# Patient Record
Sex: Male | Born: 1975
Health system: Southern US, Community
[De-identification: ages and names within clinical notes are randomized; demographics above are authoritative.]

## PROBLEM LIST (undated history)

## (undated) DIAGNOSIS — I10 Essential (primary) hypertension: Secondary | ICD-10-CM

## (undated) DIAGNOSIS — N289 Disorder of kidney and ureter, unspecified: Secondary | ICD-10-CM

## (undated) DIAGNOSIS — E119 Type 2 diabetes mellitus without complications: Secondary | ICD-10-CM

---

## 2005-06-02 ENCOUNTER — Emergency Department: Payer: Self-pay | Admitting: Emergency Medicine

## 2012-04-08 ENCOUNTER — Emergency Department (HOSPITAL_COMMUNITY)
Admission: EM | Admit: 2012-04-08 | Discharge: 2012-04-08 | Disposition: A | Discharge status: Home / Self Care | Payer: Self-pay | Attending: Emergency Medicine | Admitting: Emergency Medicine

## 2012-04-08 ENCOUNTER — Encounter (HOSPITAL_COMMUNITY): Payer: Self-pay | Admitting: *Deleted

## 2012-04-08 DIAGNOSIS — L723 Sebaceous cyst: Secondary | ICD-10-CM

## 2012-04-08 DIAGNOSIS — L0211 Cutaneous abscess of neck: Secondary | ICD-10-CM | POA: Insufficient documentation

## 2012-04-08 DIAGNOSIS — F17200 Nicotine dependence, unspecified, uncomplicated: Secondary | ICD-10-CM | POA: Insufficient documentation

## 2012-04-08 MED ORDER — LIDOCAINE-EPINEPHRINE (PF) 1 %-1:200000 IJ SOLN
INTRAMUSCULAR | Status: AC
  Filled 2012-04-08: qty 10

## 2012-04-08 MED ORDER — OXYCODONE-ACETAMINOPHEN 5-325 MG PO TABS
2.0000 | ORAL_TABLET | Freq: Once | ORAL | Status: AC
  Administered 2012-04-08: 2 via ORAL
  Filled 2012-04-08: qty 2

## 2012-04-08 MED ORDER — SULFAMETHOXAZOLE-TRIMETHOPRIM 800-160 MG PO TABS: 1.0000 | ORAL_TABLET | Freq: Two times a day (BID) | ORAL | Status: AC

## 2012-04-08 NOTE — ED Notes (Addendum)
Had swollen area to rt side of neck  For 1 year, Recently increased in size and painful.  Dizzy at times

## 2012-04-08 NOTE — ED Provider Notes (Signed)
History  This chart was scribed for Joya Gaskins, MD by Gerlean Ren. This patient was seen in room [facility] and the patient's care was started at 1:32PM.   CSN: [id number]  Arrival date & time 04/08/12  1210   First MD Initiated Contact with Patient 04/08/12 1332      Chief Complaint  Patient presents with  . Abscess     Patient is a 36 y.o. male presenting with abscess. The history is provided by the patient. No language interpreter was used.  Abscess  This is a recurrent problem. The current episode started less than one week ago. The onset was gradual. The problem occurs continuously. The problem has been gradually worsening. The abscess is present on the neck. The problem is mild. The abscess is characterized by redness. The abscess first occurred at home. Pertinent negatives include no fever and no vomiting. There were no sick contacts. He has received no recent medical care.     Stephen Davenport is a 36 y.o. male who presents to the Emergency Department complaining of gradually-worsening, constant pain associated with an abscess on right lateral neck.  Pt reports that the abscess has been present for about one year but was smaller until 3 days ago, when it began enlarged and red. Pt denies taking any OTC medications to improve pain. He denies drainage from the area. Pt denies fever, HA, visual disturbance, SOB, nausea, emesis, diarrhea and rash as associated symptoms. He does not have a h/o chronic medical conditions. He is a current, everyday smoker and reports alcohol use.   PMH - none  History reviewed. No pertinent past surgical history.  History reviewed. No pertinent family history.  History  Substance Use Topics  . Smoking status: Current Everyday Smoker  . Smokeless tobacco: Not on file  . Alcohol Use: Yes      Review of Systems  Constitutional: Negative for fever and chills.  Gastrointestinal: Negative for nausea and vomiting.  Skin:       abscess    Neurological: Negative for syncope and headaches.    Allergies  Bee venom and Shellfish allergy  Home Medications  No current outpatient prescriptions on file.  BP 128/76  Pulse 68  Temp 98.7 F (37.1 C) (Oral)  Resp 20  Ht 6' (1.829 m)  Wt 260 lb (117.935 kg)  BMI 35.26 kg/m2  SpO2 98%  Physical Exam  Nursing note and vitals reviewed.  CONSTITUTIONAL: Well developed/well nourished HEAD AND FACE: Normocephalic/atraumatic EYES: EOMI/PERRL ENMT: Mucous membranes moist NECK: supple no meningeal signs, abscess on right lateral neck with erythema and fluctuance.  It is clearly delineated from region of carotid.  No thrill noted CV: S1/S2 noted, no murmurs/rubs/gallops noted LUNGS: Lungs are clear to auscultation bilaterally, no apparent distress ABDOMEN: soft, nontender, no rebound or guarding NEURO: Pt is awake/alert, moves all extremitiesx4.  Face is symmetric EXTREMITIES: pulses normal, full ROM SKIN: warm, color normal PSYCH: no abnormalities of mood noted  ED Course  Procedures   INCISION AND DRAINAGE Performed by: Joya Gaskins Consent: Verbal consent obtained. Risks and benefits: risks, benefits and alternatives were discussed Type: abscess  Body area: right neck  Anesthesia: local infiltration  Local anesthetic: lidocaine 1% with epinephrine  Anesthetic total: 3 ml  Complexity: complex Blunt dissection to break up loculations  Drainage: purulent  Drainage amount: moderate  Packing material: 1/4 in iodoform gauze  Patient tolerance: Patient tolerated the procedure well with no immediate complications. clearly superiicial in nature, it was lateral  to carotid.  No active bleeding.  This was likely sebaceous cyst with abscess formation, advised surgical followup    DIAGNOSTIC STUDIES: Oxygen Saturation is 98% on room air, normal by my interpretation.    COORDINATION OF CARE: 1:58PM- Discussed pain medication and I&D of abscess with pt and pt  agreed.     MDM  Nursing notes including past medical history and social history reviewed and considered in documentation   I personally performed the services described in this documentation, which was scribed in my presence. The recorded information has been reviewed and considered.          Joya Gaskins, MD 04/08/12 [id number]

## 2017-03-01 ENCOUNTER — Emergency Department (HOSPITAL_COMMUNITY)
Admission: EM | Admit: 2017-03-01 | Discharge: 2017-03-01 | Disposition: A | Discharge status: Home / Self Care | Payer: Self-pay | Attending: Emergency Medicine | Admitting: Emergency Medicine

## 2017-03-01 ENCOUNTER — Encounter (HOSPITAL_COMMUNITY): Payer: Self-pay | Admitting: Emergency Medicine

## 2017-03-01 ENCOUNTER — Emergency Department (HOSPITAL_COMMUNITY): Payer: Self-pay

## 2017-03-01 DIAGNOSIS — Z87891 Personal history of nicotine dependence: Secondary | ICD-10-CM | POA: Insufficient documentation

## 2017-03-01 DIAGNOSIS — R0602 Shortness of breath: Secondary | ICD-10-CM | POA: Insufficient documentation

## 2017-03-01 DIAGNOSIS — R42 Dizziness and giddiness: Secondary | ICD-10-CM | POA: Insufficient documentation

## 2017-03-01 DIAGNOSIS — Z791 Long term (current) use of non-steroidal anti-inflammatories (NSAID): Secondary | ICD-10-CM | POA: Insufficient documentation

## 2017-03-01 DIAGNOSIS — R079 Chest pain, unspecified: Secondary | ICD-10-CM | POA: Insufficient documentation

## 2017-03-01 LAB — BASIC METABOLIC PANEL WITH GFR
Anion gap: 10 (ref 5–15)
BUN: 12 mg/dL (ref 6–20)
CO2: 23 mmol/L (ref 22–32)
Calcium: 9.3 mg/dL (ref 8.9–10.3)
Chloride: 105 mmol/L (ref 101–111)
Creatinine, Ser: 1.06 mg/dL (ref 0.61–1.24)
GFR calc Af Amer: >60 mL/min
GFR calc non Af Amer: >60 mL/min
Glucose, Bld: 115 mg/dL — ABNORMAL HIGH (ref 65–99)
Potassium: 3.5 mmol/L (ref 3.5–5.1)
Sodium: 138 mmol/L (ref 135–145)

## 2017-03-01 LAB — CBC WITH DIFFERENTIAL/PLATELET
BASOS ABS: 0 10*3/uL (ref 0.0–0.1)
BASOS PCT: 0 %
EOS ABS: 0.2 10*3/uL (ref 0.0–0.7)
Eosinophils Relative: 2 %
HCT: 39.3 % (ref 39.0–52.0)
Hemoglobin: 13.6 g/dL (ref 13.0–17.0)
Lymphocytes Relative: 21 %
Lymphs Abs: 1.6 10*3/uL (ref 0.7–4.0)
MCH: 30.8 pg (ref 26.0–34.0)
MCHC: 34.6 g/dL (ref 30.0–36.0)
MCV: 88.9 fL (ref 78.0–100.0)
MONO ABS: 0.5 10*3/uL (ref 0.1–1.0)
Monocytes Relative: 7 %
Neutro Abs: 5.2 10*3/uL (ref 1.7–7.7)
Neutrophils Relative %: 70 %
PLATELETS: 280 10*3/uL (ref 150–400)
RBC: 4.42 MIL/uL (ref 4.22–5.81)
RDW: 13.2 % (ref 11.5–15.5)
WBC: 7.5 10*3/uL (ref 4.0–10.5)

## 2017-03-01 LAB — TROPONIN I: Troponin I: <0.03 ng/mL

## 2017-03-01 NOTE — ED Notes (Signed)
Patient transported to X-ray 

## 2017-03-01 NOTE — ED Triage Notes (Signed)
Patient c/o left side chest pain that radiates into left arm and occasionally to right side x4 days. Per patient shortness of breath, nausea, and dizziness. Denies any cardiac hx. Patient reports taking advil earlier today with no relief.

## 2017-03-01 NOTE — ED Provider Notes (Signed)
[facility]-EMERGENCY DEPT Provider Note   CSN: [id number] Arrival date & time: 03/01/17  1149     History   Chief Complaint Chief Complaint  Patient presents with  . Chest Pain    HPI Stephen Davenport is a 41 y.o. male.  HPI Patient presents with chest pain. It is dull in his anterior chest. Occasionally goes to right side and has occasional heaviness on his left tricep. Former pack-a-day smoker but quit a year ago. Occasional slight shortness of breath. Has had nausea and occasional dizziness. No known cardiac history but has not been worked up. No known family history: See is adopted. States he's had it constantly for the last 4 days. He does occasionally get a break but his been mostly constant. Not worsened with exertion. No relief with rest. No diaphoresis. Not worsened with movement. Eating does not change the pain.  History reviewed. No pertinent past medical history.  There are no active problems to display for this patient.   History reviewed. No pertinent surgical history.     Home Medications    Prior to Admission medications   Medication Sig Start Date End Date Taking? Authorizing Provider  ibuprofen (ADVIL,MOTRIN) 200 MG tablet Take 400 mg by mouth every 6 (six) hours as needed.   Yes [provider]    Family History Family History  Problem Relation Age of Onset  . Adopted: Yes    Social History Social History  Substance Use Topics  . Smoking status: Former Smoker    Years: 20.00    Types: Cigarettes    Quit date: 12/16/2014  . Smokeless tobacco: Never Used  . Alcohol use No     Allergies   Bee venom and Shellfish allergy   Review of Systems Review of Systems  Constitutional: Negative for appetite change.  HENT: Negative for congestion.   Respiratory: Positive for shortness of breath.   Cardiovascular: Positive for chest pain.  Gastrointestinal: Negative for abdominal distention.  Genitourinary: Negative for dysuria.  Musculoskeletal:  Negative for back pain.  Neurological: Positive for dizziness.  Psychiatric/Behavioral: Negative for confusion.     Physical Exam Updated Vital Signs BP (!) 106/59   Pulse 60   Temp 98.1 F (36.7 C) (Oral)   Resp (!) 9   Ht 6' (1.829 m)   Wt 125.2 kg (276 lb)   SpO2 95%   BMI 37.43 kg/m   Physical Exam  Constitutional: He appears well-developed.  HENT:  Head: Atraumatic.  Eyes: EOM are normal.  Neck: Neck supple.  Pulmonary/Chest: Effort normal.  Abdominal: Soft. There is no tenderness.  Musculoskeletal: Normal range of motion. He exhibits no edema.  Neurological: He is alert.  Skin: Skin is warm. Capillary refill takes less than 2 seconds.  Psychiatric: He has a normal mood and affect.     ED Treatments / Results  Labs (all labs ordered are listed, but only abnormal results are displayed) Labs Reviewed  BASIC METABOLIC PANEL - Abnormal; Notable for the following:       Result Value   Glucose, Bld 115 (*)    All other components within normal limits  CBC WITH DIFFERENTIAL/PLATELET  TROPONIN I    EKG  EKG Interpretation  Date/Time:  Saturday March 01 2017 11:56:08 EDT Ventricular Rate:  73 PR Interval:    QRS Duration: 108 QT Interval:  413 QTC Calculation: 456 R Axis:   56 Text Interpretation:  Sinus rhythm Confirmed by Rubin Payor  MD, Tequlia Gonsalves [phone]) on 03/01/2017 12:19:16 PM  Radiology Dg Chest 2 View  Result Date: 03/01/2017 CLINICAL DATA:  Chest pain.  Shortness of breath. EXAM: CHEST  2 VIEW COMPARISON:  None. FINDINGS: Interstitial prominence, possibly bronchitic. No edema, effusion, or pneumothorax. Normal heart size mediastinal contours. IMPRESSION: Possible bronchitic airway thickening.  Otherwise negative. Electronically Signed   By: Marnee SpringJonathon  Watts M.D.   On: 03/01/2017 13:08    Procedures Procedures (including critical care time)  Medications Ordered in ED Medications - No data to display   Initial Impression / Assessment and Plan  / ED Course  I have reviewed the triage vital signs and the nursing notes.  Pertinent labs & imaging results that were available during my care of the patient were reviewed by me and considered in my medical decision making (see chart for details).     Patient with chest pain. EKG reassuring. Labs reassuring. X-ray shows possible bronchitis. No focal pneumonia. Will discharge home. Discussed with patient and he'll follow-up with her primary care provider. Doubt cardiac cause. Troponin negative.  Final Clinical Impressions(s) / ED Diagnoses   Final diagnoses:  Nonspecific chest pain    New Prescriptions Discharge Medication List as of 03/01/2017  1:50 PM       Benjiman CorePickering, Shaneta Cervenka, MD 03/01/17 2032

## 2017-12-31 ENCOUNTER — Other Ambulatory Visit (HOSPITAL_COMMUNITY): Payer: Self-pay | Admitting: Family Medicine

## 2017-12-31 ENCOUNTER — Ambulatory Visit (HOSPITAL_COMMUNITY)
Admission: RE | Admit: 2017-12-31 | Discharge: 2017-12-31 | Disposition: A | Discharge status: Home / Self Care | Payer: BLUE CROSS/BLUE SHIELD | Source: Ambulatory Visit | Attending: Family Medicine | Admitting: Family Medicine

## 2017-12-31 DIAGNOSIS — M25551 Pain in right hip: Secondary | ICD-10-CM | POA: Insufficient documentation

## 2017-12-31 DIAGNOSIS — R52 Pain, unspecified: Secondary | ICD-10-CM

## 2017-12-31 DIAGNOSIS — Z0001 Encounter for general adult medical examination with abnormal findings: Secondary | ICD-10-CM | POA: Diagnosis not present

## 2017-12-31 DIAGNOSIS — M545 Low back pain: Secondary | ICD-10-CM | POA: Diagnosis not present

## 2017-12-31 DIAGNOSIS — M1611 Unilateral primary osteoarthritis, right hip: Secondary | ICD-10-CM | POA: Diagnosis not present

## 2017-12-31 DIAGNOSIS — M47816 Spondylosis without myelopathy or radiculopathy, lumbar region: Secondary | ICD-10-CM | POA: Insufficient documentation

## 2017-12-31 DIAGNOSIS — R739 Hyperglycemia, unspecified: Secondary | ICD-10-CM | POA: Diagnosis not present

## 2017-12-31 DIAGNOSIS — Z0000 Encounter for general adult medical examination without abnormal findings: Secondary | ICD-10-CM | POA: Diagnosis not present

## 2017-12-31 DIAGNOSIS — K219 Gastro-esophageal reflux disease without esophagitis: Secondary | ICD-10-CM | POA: Diagnosis not present

## 2017-12-31 DIAGNOSIS — Z6839 Body mass index (BMI) 39.0-39.9, adult: Secondary | ICD-10-CM | POA: Diagnosis not present

## 2017-12-31 DIAGNOSIS — Z1389 Encounter for screening for other disorder: Secondary | ICD-10-CM | POA: Diagnosis not present

## 2017-12-31 DIAGNOSIS — Z23 Encounter for immunization: Secondary | ICD-10-CM | POA: Diagnosis not present

## 2017-12-31 DIAGNOSIS — R079 Chest pain, unspecified: Secondary | ICD-10-CM | POA: Diagnosis not present

## 2018-01-01 DIAGNOSIS — R748 Abnormal levels of other serum enzymes: Secondary | ICD-10-CM | POA: Diagnosis not present

## 2018-01-14 DIAGNOSIS — M545 Low back pain: Secondary | ICD-10-CM | POA: Diagnosis not present

## 2018-01-14 DIAGNOSIS — E119 Type 2 diabetes mellitus without complications: Secondary | ICD-10-CM | POA: Diagnosis not present

## 2018-01-14 DIAGNOSIS — M25559 Pain in unspecified hip: Secondary | ICD-10-CM | POA: Diagnosis not present

## 2018-01-28 DIAGNOSIS — M7061 Trochanteric bursitis, right hip: Secondary | ICD-10-CM | POA: Diagnosis not present

## 2018-01-28 DIAGNOSIS — M25551 Pain in right hip: Secondary | ICD-10-CM | POA: Diagnosis not present

## 2018-01-28 DIAGNOSIS — M545 Low back pain: Secondary | ICD-10-CM | POA: Diagnosis not present

## 2018-02-06 DIAGNOSIS — N200 Calculus of kidney: Secondary | ICD-10-CM | POA: Diagnosis not present

## 2018-02-06 DIAGNOSIS — R945 Abnormal results of liver function studies: Secondary | ICD-10-CM | POA: Diagnosis not present

## 2018-02-06 DIAGNOSIS — R932 Abnormal findings on diagnostic imaging of liver and biliary tract: Secondary | ICD-10-CM | POA: Diagnosis not present

## 2018-02-06 DIAGNOSIS — Z6838 Body mass index (BMI) 38.0-38.9, adult: Secondary | ICD-10-CM | POA: Diagnosis not present

## 2018-11-13 DIAGNOSIS — E119 Type 2 diabetes mellitus without complications: Secondary | ICD-10-CM | POA: Diagnosis not present

## 2018-11-13 DIAGNOSIS — Z6836 Body mass index (BMI) 36.0-36.9, adult: Secondary | ICD-10-CM | POA: Diagnosis not present

## 2018-11-13 DIAGNOSIS — E6609 Other obesity due to excess calories: Secondary | ICD-10-CM | POA: Diagnosis not present

## 2018-11-13 DIAGNOSIS — Z1389 Encounter for screening for other disorder: Secondary | ICD-10-CM | POA: Diagnosis not present

## 2018-11-13 DIAGNOSIS — E781 Pure hyperglyceridemia: Secondary | ICD-10-CM | POA: Diagnosis not present

## 2019-06-30 IMAGING — DX DG HIP (WITH OR WITHOUT PELVIS) 4+V*R*
3 series · 3 of 3 positions shown · non-contrast
Comparison: None.

CLINICAL DATA: Right-sided hip pain for several years, no known
injury, initial encounter

EXAM:
DG HIP (WITH OR WITHOUT PELVIS) 3V RIGHT

[pelvis ap]
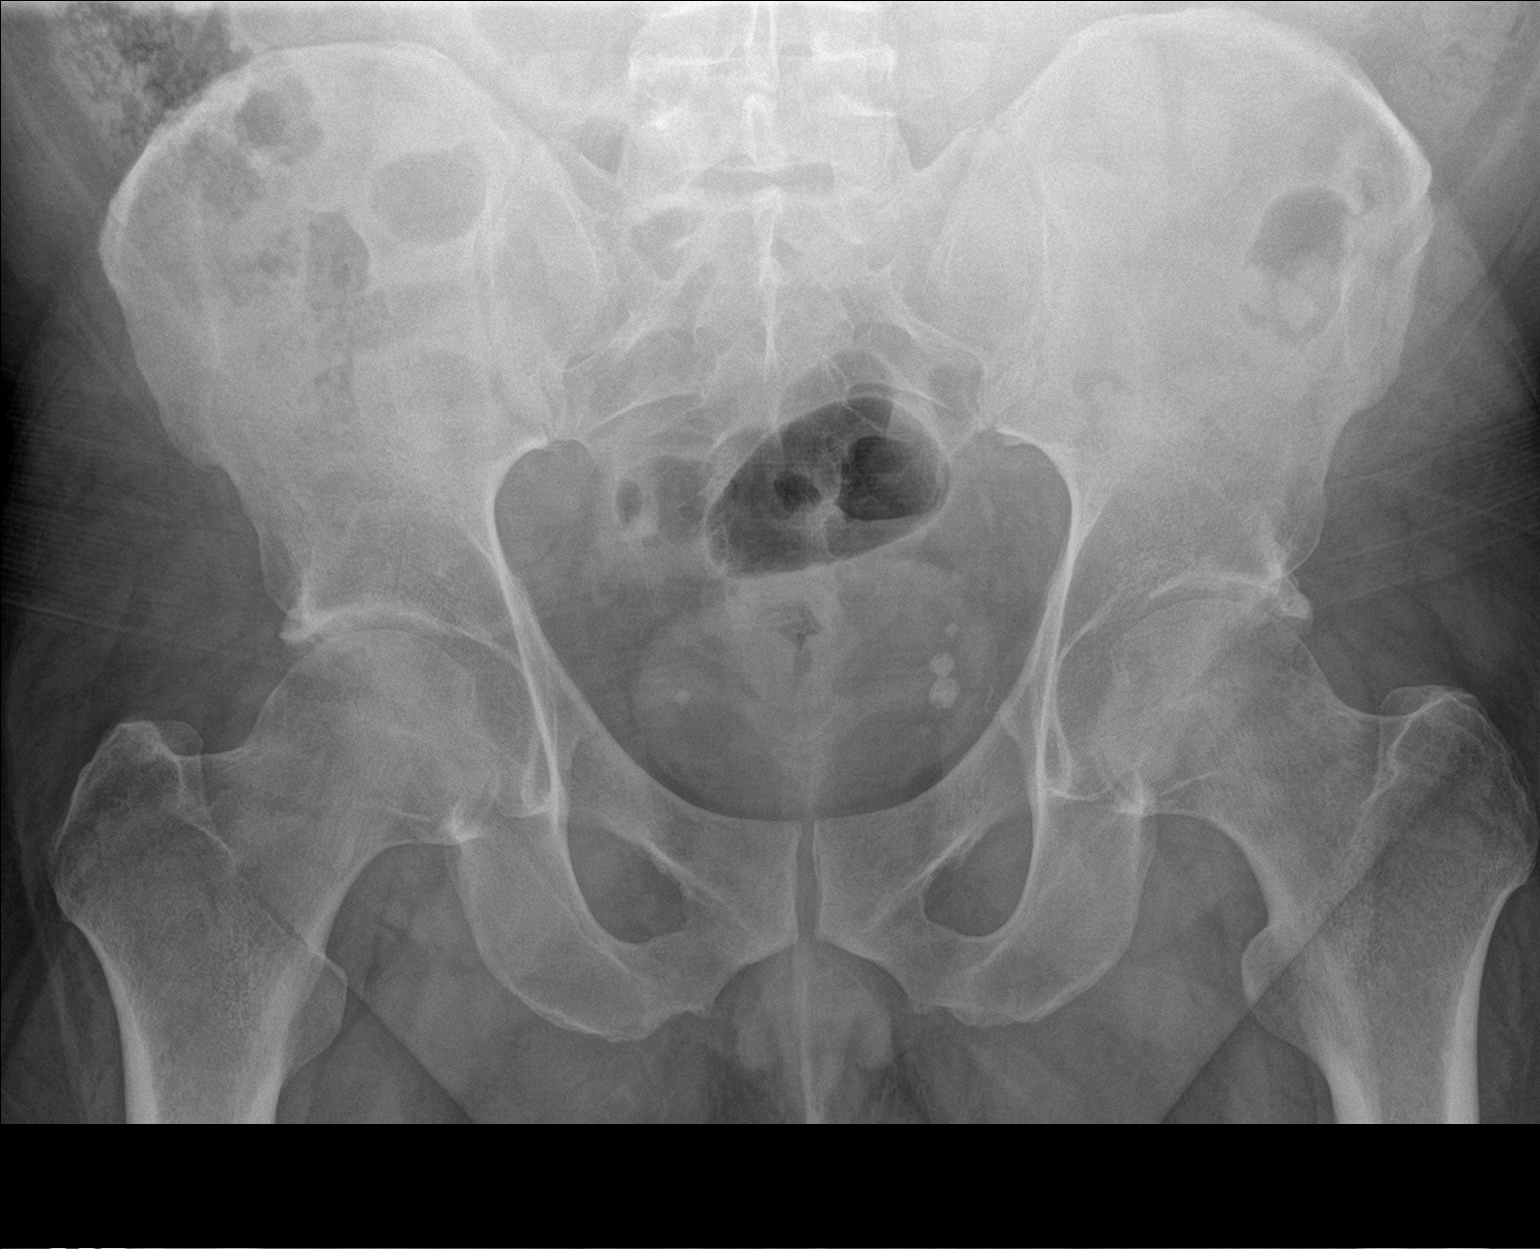

[hip ap]
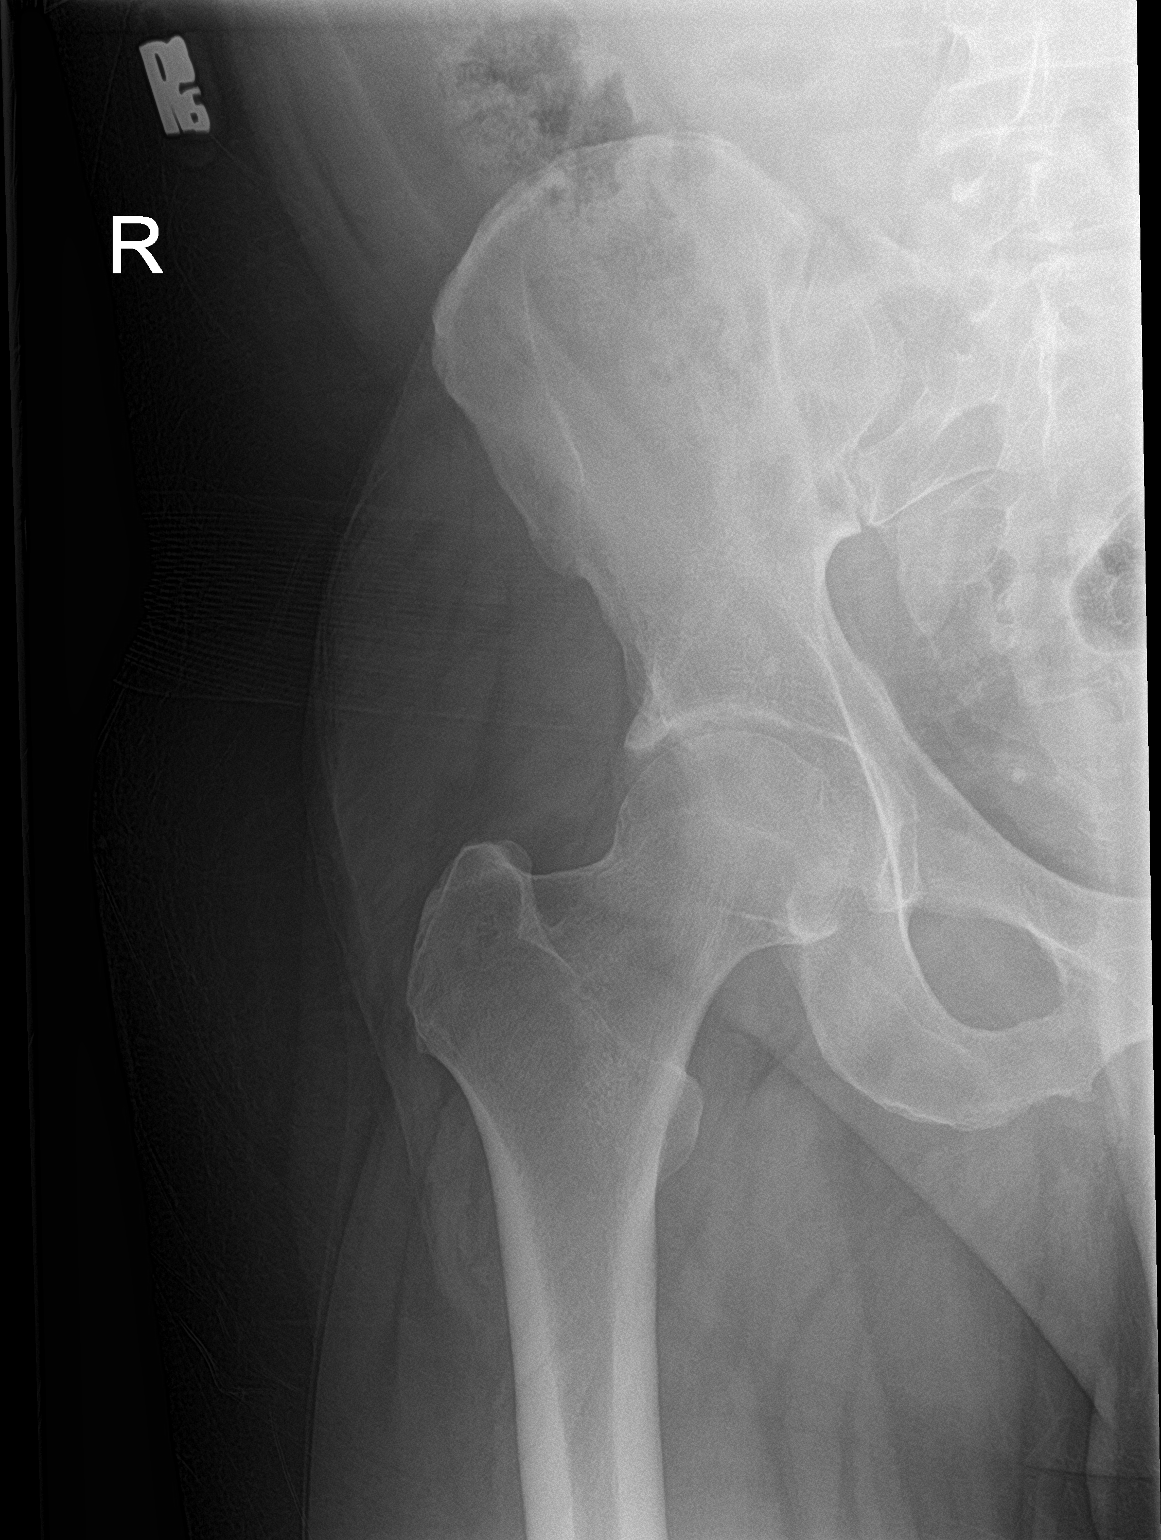

[hip lat]
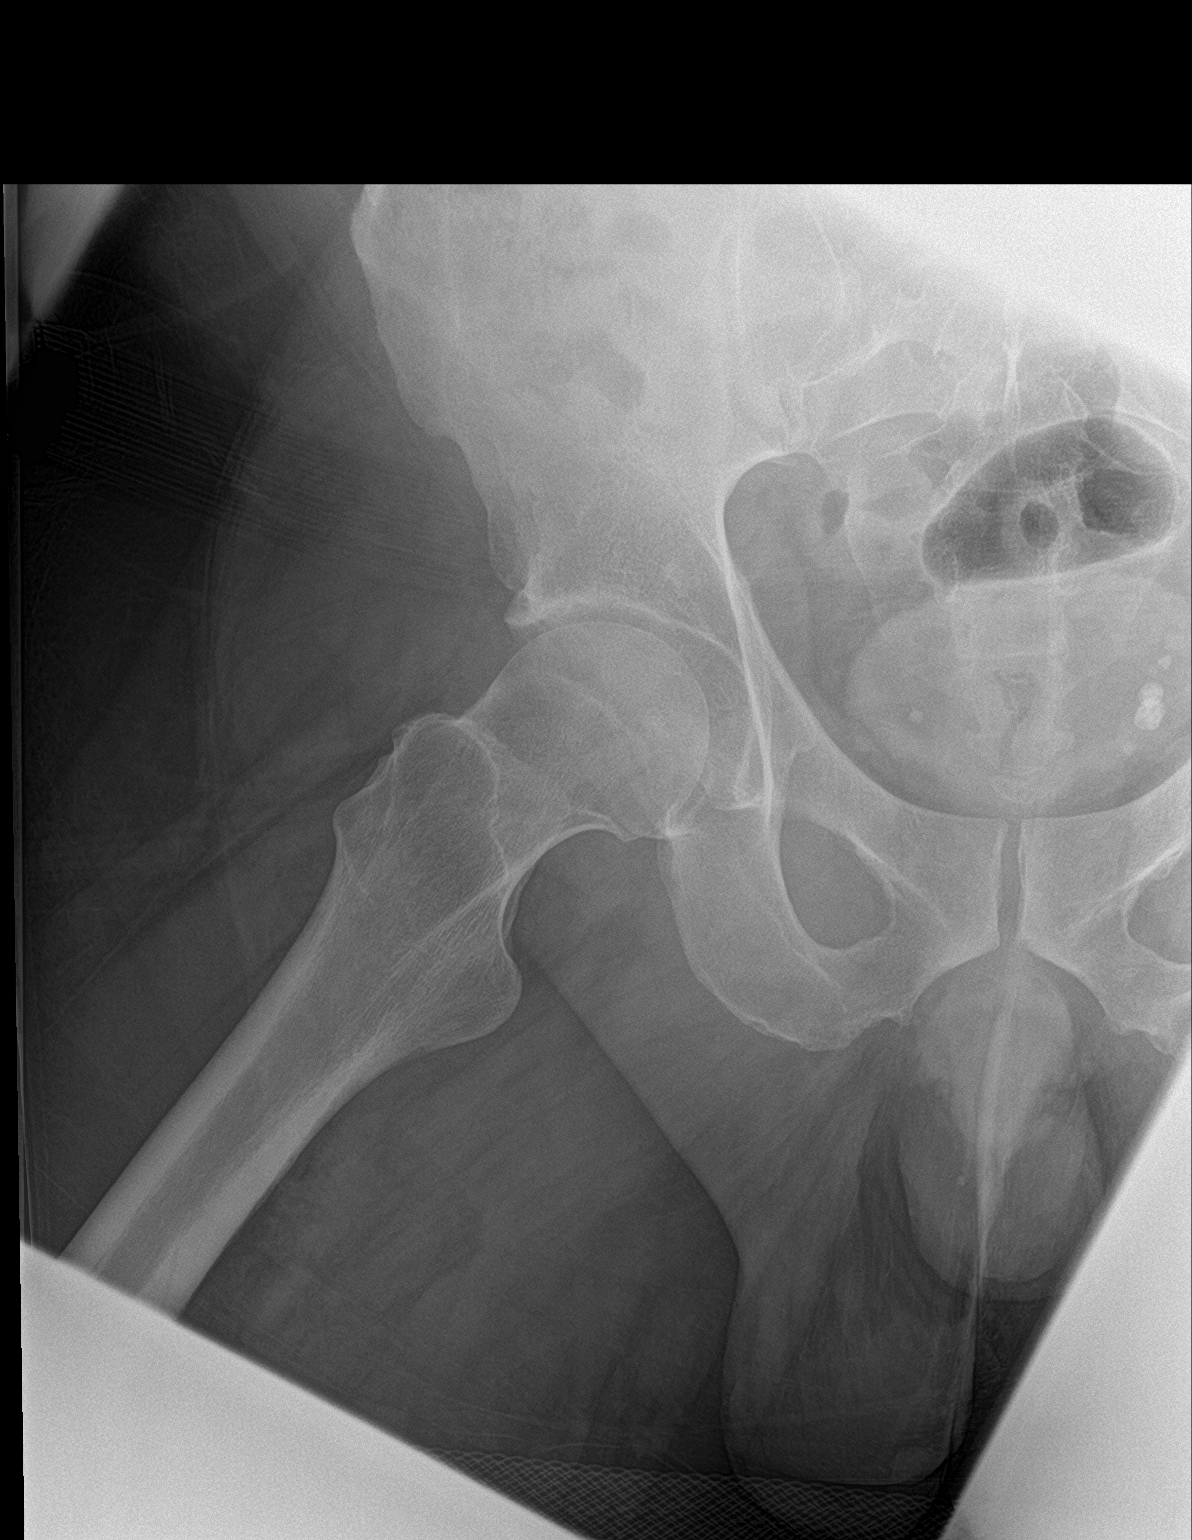

[3 of 3 positions shown; findings below may reference images not displayed]

FINDINGS: Pelvic ring is intact. Mild degenerative changes of the hip joints
are noted bilaterally. No soft tissue abnormality is seen. No acute
fracture or dislocation is noted.
IMPRESSION: Mild degenerative change without acute abnormality.

## 2019-11-12 DIAGNOSIS — K219 Gastro-esophageal reflux disease without esophagitis: Secondary | ICD-10-CM | POA: Diagnosis not present

## 2019-11-12 DIAGNOSIS — E6609 Other obesity due to excess calories: Secondary | ICD-10-CM | POA: Diagnosis not present

## 2019-11-12 DIAGNOSIS — E7849 Other hyperlipidemia: Secondary | ICD-10-CM | POA: Diagnosis not present

## 2019-11-12 DIAGNOSIS — E1165 Type 2 diabetes mellitus with hyperglycemia: Secondary | ICD-10-CM | POA: Diagnosis not present

## 2019-11-12 DIAGNOSIS — Z6834 Body mass index (BMI) 34.0-34.9, adult: Secondary | ICD-10-CM | POA: Diagnosis not present

## 2019-11-12 DIAGNOSIS — Z0001 Encounter for general adult medical examination with abnormal findings: Secondary | ICD-10-CM | POA: Diagnosis not present

## 2020-02-11 DIAGNOSIS — Z6834 Body mass index (BMI) 34.0-34.9, adult: Secondary | ICD-10-CM | POA: Diagnosis not present

## 2020-02-11 DIAGNOSIS — E119 Type 2 diabetes mellitus without complications: Secondary | ICD-10-CM | POA: Diagnosis not present

## 2020-02-11 DIAGNOSIS — E7849 Other hyperlipidemia: Secondary | ICD-10-CM | POA: Diagnosis not present

## 2020-02-11 DIAGNOSIS — E6609 Other obesity due to excess calories: Secondary | ICD-10-CM | POA: Diagnosis not present

## 2020-05-19 DIAGNOSIS — K219 Gastro-esophageal reflux disease without esophagitis: Secondary | ICD-10-CM | POA: Diagnosis not present

## 2020-05-19 DIAGNOSIS — Z6834 Body mass index (BMI) 34.0-34.9, adult: Secondary | ICD-10-CM | POA: Diagnosis not present

## 2020-05-19 DIAGNOSIS — E6609 Other obesity due to excess calories: Secondary | ICD-10-CM | POA: Diagnosis not present

## 2020-05-19 DIAGNOSIS — E119 Type 2 diabetes mellitus without complications: Secondary | ICD-10-CM | POA: Diagnosis not present

## 2020-11-21 DIAGNOSIS — M5416 Radiculopathy, lumbar region: Secondary | ICD-10-CM | POA: Diagnosis not present

## 2020-11-21 DIAGNOSIS — Z1331 Encounter for screening for depression: Secondary | ICD-10-CM | POA: Diagnosis not present

## 2020-11-21 DIAGNOSIS — E6609 Other obesity due to excess calories: Secondary | ICD-10-CM | POA: Diagnosis not present

## 2020-11-21 DIAGNOSIS — M5126 Other intervertebral disc displacement, lumbar region: Secondary | ICD-10-CM | POA: Diagnosis not present

## 2020-11-21 DIAGNOSIS — Z6835 Body mass index (BMI) 35.0-35.9, adult: Secondary | ICD-10-CM | POA: Diagnosis not present

## 2020-12-19 DIAGNOSIS — M5416 Radiculopathy, lumbar region: Secondary | ICD-10-CM | POA: Diagnosis not present

## 2021-01-12 DIAGNOSIS — M5416 Radiculopathy, lumbar region: Secondary | ICD-10-CM | POA: Diagnosis not present

## 2021-01-22 ENCOUNTER — Other Ambulatory Visit: Payer: Self-pay

## 2021-01-22 ENCOUNTER — Encounter (HOSPITAL_COMMUNITY): Payer: Self-pay

## 2021-01-22 ENCOUNTER — Emergency Department (HOSPITAL_COMMUNITY)
Admission: EM | Admit: 2021-01-22 | Discharge: 2021-01-22 | Disposition: A | Discharge status: Home / Self Care | Payer: BC Managed Care – PPO | Attending: Emergency Medicine | Admitting: Emergency Medicine

## 2021-01-22 DIAGNOSIS — M5442 Lumbago with sciatica, left side: Secondary | ICD-10-CM | POA: Diagnosis not present

## 2021-01-22 DIAGNOSIS — Z87891 Personal history of nicotine dependence: Secondary | ICD-10-CM | POA: Diagnosis not present

## 2021-01-22 DIAGNOSIS — M545 Low back pain, unspecified: Secondary | ICD-10-CM | POA: Diagnosis not present

## 2021-01-22 DIAGNOSIS — M549 Dorsalgia, unspecified: Secondary | ICD-10-CM | POA: Diagnosis not present

## 2021-01-22 MED ORDER — KETOROLAC TROMETHAMINE 30 MG/ML IJ SOLN
30.0000 mg | Freq: Once | INTRAMUSCULAR | Status: AC
  Administered 2021-01-22: 30 mg via INTRAVENOUS
  Filled 2021-01-22: qty 1

## 2021-01-22 MED ORDER — HYDROCODONE-ACETAMINOPHEN 5-325 MG PO TABS: 1.0000 | ORAL_TABLET | Freq: Four times a day (QID) | ORAL | 0 refills | Status: AC | PRN

## 2021-01-22 MED ORDER — METHYL SALICYLATE-LIDO-MENTHOL 4-4-5 % EX PTCH: 1.0000 | MEDICATED_PATCH | Freq: Two times a day (BID) | CUTANEOUS | 0 refills | Status: AC

## 2021-01-22 MED ORDER — DICLOFENAC EPOLAMINE 1.3 % EX PTCH
1.0000 | MEDICATED_PATCH | Freq: Once | CUTANEOUS | Status: DC
  Administered 2021-01-22: 1 via TRANSDERMAL
  Filled 2021-01-22 (×2): qty 1

## 2021-01-22 MED ORDER — DEXAMETHASONE SODIUM PHOSPHATE 10 MG/ML IJ SOLN
10.0000 mg | Freq: Once | INTRAMUSCULAR | Status: AC
  Administered 2021-01-22: 10 mg via INTRAMUSCULAR
  Filled 2021-01-22: qty 1

## 2021-01-22 MED ORDER — MORPHINE SULFATE (PF) 4 MG/ML IV SOLN
4.0000 mg | Freq: Once | INTRAVENOUS | Status: AC
  Administered 2021-01-22: 4 mg via INTRAMUSCULAR
  Filled 2021-01-22: qty 1

## 2021-01-22 MED ORDER — HYDROCODONE-ACETAMINOPHEN 5-325 MG PO TABS
1.0000 | ORAL_TABLET | Freq: Once | ORAL | Status: AC
  Administered 2021-01-22: 1 via ORAL

## 2021-01-22 MED ORDER — PREDNISONE 20 MG PO TABS: 40.0000 mg | ORAL_TABLET | Freq: Every day | ORAL | 0 refills | Status: AC

## 2021-01-22 NOTE — ED Provider Notes (Signed)
[facility] EMERGENCY DEPARTMENT Provider Note   CSN: [id number] Arrival date & time: 01/22/21  1537     History Chief Complaint  Patient presents with  . Back Pain    Stephen Davenport is a 45 y.o. male.  HPI Patient presents with concern of severe low back pain.  Patient has a history of issues going back months, possibly longer.  Now, over the past 2 weeks he has had worsening pain focally in the left SI superior region.  Pain is severe, sharp, radiates down his left leg.  Pain is worse with attempted ambulation.  Patient is currently taking tramadol and gabapentin, notes that his pain has been persistent.  He was seen and evaluated by our orthopedic colleagues 10 days ago, had MRI performed, and is scheduled for follow-up visit in 4 days.  No interval fever, vomiting, abdominal pain, incontinence, falls.  Today after an acute exacerbation while trying to stand, with worsening symptoms following an attempt to return upright, he presents via EMS due to inability to do so.  Chart review notable for orthopedic visit last week with some documentation of radiculopathy, order for MRI, no results available.    History reviewed. No pertinent past medical history.  There are no problems to display for this patient.   History reviewed. No pertinent surgical history.     Family History  Adopted: Yes    Social History   Tobacco Use  . Smoking status: Former Smoker    Years: 20.00    Types: Cigarettes    Quit date: 12/16/2014    Years since quitting: 6.1  . Smokeless tobacco: Never Used  Vaping Use  . Vaping Use: Every day  Substance Use Topics  . Alcohol use: No  . Drug use: No    Home Medications Prior to Admission medications   Medication Sig Start Date End Date Taking? Authorizing Provider  HYDROcodone-acetaminophen (NORCO/VICODIN) 5-325 MG tablet Take 1 tablet by mouth every 6 (six) hours as needed for severe pain. 01/22/21  Yes Gerhard Munch, MD  Methyl  Salicylate-Lido-Menthol 4-4-5 % PTCH Apply 1 patch topically in the morning and at bedtime. 01/22/21  Yes Gerhard Munch, MD  predniSONE (DELTASONE) 20 MG tablet Take 2 tablets (40 mg total) by mouth daily with breakfast. For the next four days 01/22/21  Yes Gerhard Munch, MD  ibuprofen (ADVIL,MOTRIN) 200 MG tablet Take 400 mg by mouth every 6 (six) hours as needed.    [provider]    Allergies    Bee venom  Review of Systems   Review of Systems  Constitutional:       Per HPI, otherwise negative  HENT:       Per HPI, otherwise negative  Respiratory:       Per HPI, otherwise negative  Cardiovascular:       Per HPI, otherwise negative  Gastrointestinal: Negative for vomiting.  Endocrine:       Negative aside from HPI  Genitourinary:       Neg aside from HPI   Musculoskeletal:       Per HPI, otherwise negative  Skin: Negative.   Neurological: Negative for syncope.    Physical Exam Updated Vital Signs BP 117/67   Pulse 91   Temp 98.2 F (36.8 C) (Oral)   Resp 18   Ht 6' (1.829 m)   Wt 117.9 kg   SpO2 93%   BMI 35.26 kg/m   Physical Exam Vitals and nursing note reviewed.  Constitutional:  General: He is not in acute distress.    Appearance: He is well-developed.  HENT:     Head: Normocephalic and atraumatic.  Eyes:     Conjunctiva/sclera: Conjunctivae normal.  Cardiovascular:     Rate and Rhythm: Normal rate and regular rhythm.  Pulmonary:     Effort: Pulmonary effort is normal. No respiratory distress.     Breath sounds: No stridor.  Abdominal:     General: There is no distension.     Tenderness: There is no abdominal tenderness. There is no guarding.  Musculoskeletal:       Back:     Comments: Patient can flex each hip to command, and against resistance, there is diminished strength in the left.  Left lower leg atrophy is appreciable.  Skin:    General: Skin is warm and dry.  Neurological:     Mental Status: He is alert and oriented to  person, place, and time.     Comments: Aside from atrophy neurologic exam unremarkable.     ED Results / Procedures / Treatments   Labs (all labs ordered are listed, but only abnormal results are displayed) Labs Reviewed - No data to display  EKG None  Radiology No results found.  Procedures Procedures 7:21 PM   Medications Ordered in ED Medications  diclofenac (FLECTOR) 1.3 % 1 patch (1 patch Transdermal Patch Applied 01/22/21 1652)  ketorolac (TORADOL) 30 MG/ML injection 30 mg (30 mg Intravenous Given 01/22/21 1649)  dexamethasone (DECADRON) injection 10 mg (10 mg Intramuscular Given 01/22/21 1652)  morphine 4 MG/ML injection 4 mg (4 mg Intramuscular Given 01/22/21 1651)  HYDROcodone-acetaminophen (NORCO/VICODIN) 5-325 MG per tablet 1 tablet (1 tablet Oral Given 01/22/21 1754)    ED Course  I have reviewed the triage vital signs and the nursing notes.  Pertinent labs & imaging results that were available during my care of the patient were reviewed by me and considered in my medical decision making (see chart for details).   Initial interventions included Toradol, Decadron, Flector patch    7:21 PM Now following initial interventions as above, and morphine, awakening, patient is able to ambulate, though with some discomfort, and assistance. He has no other new focal complaints, states that he is comfortable enough to make it to his appointment on Friday.  Without ever red flags for CNS involvement, suspicion for severe radiculopathy secondary to lower lumbar lesions, patient appropriate for follow-up with his orthopedist in the coming days. MDM Rules/Calculators/A&P MDM Number of Diagnoses or Management Options Acute left-sided low back pain with left-sided sciatica: established, worsening   Amount and/or Complexity of Data Reviewed Clinical lab tests: ordered and reviewed Tests in the medicine section of CPT: reviewed and ordered Decide to obtain previous medical records or  to obtain history from someone other than the patient: yes Review and summarize past medical records: yes  Risk of Complications, Morbidity, and/or Mortality Presenting problems: high Diagnostic procedures: high Management options: high  Critical Care Total time providing critical care: < 30 minutes  Patient Progress Patient progress: stable  Final Clinical Impression(s) / ED Diagnoses Final diagnoses:  Acute left-sided low back pain with left-sided sciatica    Rx / DC Orders ED Discharge Orders         Ordered    HYDROcodone-acetaminophen (NORCO/VICODIN) 5-325 MG tablet  Every 6 hours PRN        01/22/21 1920    predniSONE (DELTASONE) 20 MG tablet  Daily with breakfast  01/22/21 1920    Methyl Salicylate-Lido-Menthol 4-4-5 % PTCH  2 times daily        01/22/21 Sharyn Creamer, MD 01/22/21 1921

## 2021-01-22 NOTE — ED Triage Notes (Signed)
Back pain since January, in pain today, needing pain control, MRI completed, 2 weeks ago, Lower back pain (S1) pain 10/10

## 2021-01-22 NOTE — Discharge Instructions (Addendum)
As discussed, you are likely to have some ongoing mild discomfort, but if you develop new, or concerning changes, do not hesitate to return here.  Otherwise follow-up with your orthopedist as scheduled.

## 2021-01-26 DIAGNOSIS — G894 Chronic pain syndrome: Secondary | ICD-10-CM | POA: Diagnosis not present

## 2021-01-26 DIAGNOSIS — M5416 Radiculopathy, lumbar region: Secondary | ICD-10-CM | POA: Diagnosis not present

## 2021-01-26 DIAGNOSIS — R52 Pain, unspecified: Secondary | ICD-10-CM | POA: Diagnosis not present

## 2021-02-23 DIAGNOSIS — M5136 Other intervertebral disc degeneration, lumbar region: Secondary | ICD-10-CM | POA: Diagnosis not present

## 2021-02-23 DIAGNOSIS — M519 Unspecified thoracic, thoracolumbar and lumbosacral intervertebral disc disorder: Secondary | ICD-10-CM | POA: Diagnosis not present

## 2021-04-19 ENCOUNTER — Emergency Department (HOSPITAL_COMMUNITY)
Admission: EM | Admit: 2021-04-19 | Discharge: 2021-04-19 | Disposition: A | Discharge status: Home / Self Care | Payer: BC Managed Care – PPO | Attending: Emergency Medicine | Admitting: Emergency Medicine

## 2021-04-19 ENCOUNTER — Encounter (HOSPITAL_COMMUNITY): Payer: Self-pay | Admitting: Emergency Medicine

## 2021-04-19 ENCOUNTER — Other Ambulatory Visit: Payer: Self-pay

## 2021-04-19 ENCOUNTER — Emergency Department (HOSPITAL_COMMUNITY): Payer: BC Managed Care – PPO

## 2021-04-19 DIAGNOSIS — R109 Unspecified abdominal pain: Secondary | ICD-10-CM

## 2021-04-19 DIAGNOSIS — Z87891 Personal history of nicotine dependence: Secondary | ICD-10-CM | POA: Diagnosis not present

## 2021-04-19 DIAGNOSIS — I10 Essential (primary) hypertension: Secondary | ICD-10-CM | POA: Insufficient documentation

## 2021-04-19 DIAGNOSIS — N132 Hydronephrosis with renal and ureteral calculous obstruction: Secondary | ICD-10-CM | POA: Diagnosis not present

## 2021-04-19 DIAGNOSIS — E119 Type 2 diabetes mellitus without complications: Secondary | ICD-10-CM | POA: Insufficient documentation

## 2021-04-19 DIAGNOSIS — N3289 Other specified disorders of bladder: Secondary | ICD-10-CM | POA: Diagnosis not present

## 2021-04-19 DIAGNOSIS — N201 Calculus of ureter: Secondary | ICD-10-CM

## 2021-04-19 DIAGNOSIS — N200 Calculus of kidney: Secondary | ICD-10-CM

## 2021-04-19 HISTORY — DX: Type 2 diabetes mellitus without complications: E11.9

## 2021-04-19 HISTORY — DX: Essential (primary) hypertension: I10

## 2021-04-19 HISTORY — DX: Disorder of kidney and ureter, unspecified: N28.9

## 2021-04-19 LAB — URINALYSIS, ROUTINE W REFLEX MICROSCOPIC
Bacteria, UA: NONE SEEN
Bilirubin Urine: NEGATIVE
Glucose, UA: NEGATIVE mg/dL
Ketones, ur: NEGATIVE mg/dL
Leukocytes,Ua: NEGATIVE
Nitrite: NEGATIVE
Protein, ur: 100 mg/dL — AB
RBC / HPF: >50 RBC/hpf — ABNORMAL HIGH (ref 0–5)
Specific Gravity, Urine: 1.018 (ref 1.005–1.030)
pH: 5 (ref 5.0–8.0)

## 2021-04-19 MED ORDER — KETOROLAC TROMETHAMINE 30 MG/ML IJ SOLN
30.0000 mg | Freq: Once | INTRAMUSCULAR | Status: AC
  Administered 2021-04-19: 30 mg via INTRAVENOUS
  Filled 2021-04-19: qty 1

## 2021-04-19 MED ORDER — ONDANSETRON 8 MG PO TBDP: 8.0000 mg | ORAL_TABLET | Freq: Three times a day (TID) | ORAL | 0 refills | Status: AC | PRN

## 2021-04-19 MED ORDER — TAMSULOSIN HCL 0.4 MG PO CAPS: 0.4000 mg | ORAL_CAPSULE | Freq: Every day | ORAL | 0 refills | Status: AC

## 2021-04-19 MED ORDER — HYDROMORPHONE HCL 1 MG/ML IJ SOLN
0.5000 mg | Freq: Once | INTRAMUSCULAR | Status: AC
  Administered 2021-04-19: 0.5 mg via INTRAVENOUS
  Filled 2021-04-19: qty 1

## 2021-04-19 MED ORDER — OXYCODONE-ACETAMINOPHEN 5-325 MG PO TABS: 1.0000 | ORAL_TABLET | Freq: Four times a day (QID) | ORAL | 0 refills | Status: AC | PRN

## 2021-04-19 NOTE — ED Triage Notes (Signed)
Pt to the ED with c/o left flank pain that woke him this morning.  Pt c/o dark urine.

## 2021-04-19 NOTE — ED Provider Notes (Signed)
[facility] EMERGENCY DEPARTMENT Provider Note   CSN: [id number] Arrival date & time: 04/19/21  0710     History Chief Complaint  Patient presents with   Flank Pain    Nicholaos Schippers is a 45 y.o. male.  Patient c/o acute onset left flank pain posteriorly while sleeping/resting this AM. Symptoms acute onset, moderate, dull, constant, persistent, non radiating. No vomiting or diarrhea. No dysuria or hematuria. No fever or chills. States hx ddd and back pain, but seems different. Denies recent back injury or strain. No radicular pain. No saddle area or leg numbness. No weakness. No problems w normal bowel or bladder control. Remote hx kidney stone.   The history is provided by the patient.  Flank Pain Pertinent negatives include no chest pain, no abdominal pain and no shortness of breath.      Past Medical History:  Diagnosis Date   Diabetes mellitus without complication (HCC)    Hypertension    Renal disorder     There are no problems to display for this patient.   History reviewed. No pertinent surgical history.     Family History  Adopted: Yes    Social History   Tobacco Use   Smoking status: Former    Years: 20.00    Types: Cigarettes    Quit date: 12/16/2014    Years since quitting: 6.3   Smokeless tobacco: Never  Vaping Use   Vaping Use: Every day   Substances: Nicotine, Flavoring  Substance Use Topics   Alcohol use: No   Drug use: No    Home Medications Prior to Admission medications   Medication Sig Start Date End Date Taking? Authorizing Provider  HYDROcodone-acetaminophen (NORCO/VICODIN) 5-325 MG tablet Take 1 tablet by mouth every 6 (six) hours as needed for severe pain. 01/22/21   Gerhard Munch, MD  ibuprofen (ADVIL,MOTRIN) 200 MG tablet Take 400 mg by mouth every 6 (six) hours as needed.    [provider]  Methyl Salicylate-Lido-Menthol 4-4-5 % PTCH Apply 1 patch topically in the morning and at bedtime. 01/22/21   Gerhard Munch, MD   predniSONE (DELTASONE) 20 MG tablet Take 2 tablets (40 mg total) by mouth daily with breakfast. For the next four days 01/22/21   Gerhard Munch, MD    Allergies    Bee venom  Review of Systems   Review of Systems  Constitutional:  Negative for fever.  HENT:  Negative for sore throat.   Eyes:  Negative for redness.  Respiratory:  Negative for shortness of breath.   Cardiovascular:  Negative for chest pain.  Gastrointestinal:  Negative for abdominal pain.  Genitourinary:  Positive for flank pain. Negative for dysuria and hematuria.  Musculoskeletal:  Negative for neck pain.  Skin:  Negative for rash.  Neurological:  Negative for weakness and numbness.  Hematological:  Does not bruise/bleed easily.  Psychiatric/Behavioral:  Negative for confusion.    Physical Exam Updated Vital Signs BP (!) 153/89   Pulse 79   Temp 97.9 F (36.6 C) (Oral)   Resp 18   Ht 1.829 m (6')   Wt 120.2 kg   SpO2 96%   BMI 35.94 kg/m   Physical Exam Vitals and nursing note reviewed.  Constitutional:      Appearance: Normal appearance. He is well-developed.  HENT:     Head: Atraumatic.     Nose: Nose normal.     Mouth/Throat:     Mouth: Mucous membranes are moist.  Eyes:     General: No  scleral icterus.    Conjunctiva/sclera: Conjunctivae normal.  Neck:     Trachea: No tracheal deviation.  Cardiovascular:     Rate and Rhythm: Normal rate and regular rhythm.     Pulses: Normal pulses.     Heart sounds: Normal heart sounds. No murmur heard.   No friction rub. No gallop.  Pulmonary:     Effort: Pulmonary effort is normal. No accessory muscle usage or respiratory distress.     Breath sounds: Normal breath sounds.  Abdominal:     General: Bowel sounds are normal. There is no distension.     Palpations: Abdomen is soft.     Tenderness: There is no abdominal tenderness. There is no guarding.  Genitourinary:    Comments: No cva tenderness. Musculoskeletal:        General: No swelling.      Cervical back: Normal range of motion and neck supple. No rigidity.     Comments: L/S spine non-tender, aligned. No sts, no skin changes, lesions or erythema in area of pain.   Skin:    General: Skin is warm and dry.     Findings: No rash.  Neurological:     Mental Status: He is alert.     Comments: Alert, speech clear. Motor/sens grossly intact bil. Steady gait.   Psychiatric:        Mood and Affect: Mood normal.    ED Results / Procedures / Treatments   Labs (all labs ordered are listed, but only abnormal results are displayed) Results for orders placed or performed during the hospital encounter of 04/19/21  Urinalysis, Routine w reflex microscopic Urine, Clean Catch  Result Value Ref Range   Color, Urine AMBER (A) YELLOW   APPearance CLOUDY (A) CLEAR   Specific Gravity, Urine 1.018 1.005 - 1.030   pH 5.0 5.0 - 8.0   Glucose, UA NEGATIVE NEGATIVE mg/dL   Hgb urine dipstick LARGE (A) NEGATIVE   Bilirubin Urine NEGATIVE NEGATIVE   Ketones, ur NEGATIVE NEGATIVE mg/dL   Protein, ur 650 (A) NEGATIVE mg/dL   Nitrite NEGATIVE NEGATIVE   Leukocytes,Ua NEGATIVE NEGATIVE   RBC / HPF >50 (H) 0 - 5 RBC/hpf   WBC, UA 21-50 0 - 5 WBC/hpf   Bacteria, UA NONE SEEN NONE SEEN   Squamous Epithelial / LPF 0-5 0 - 5   WBC Clumps PRESENT    Mucus PRESENT    Budding Yeast PRESENT    CT Renal Stone Study  Result Date: 04/19/2021 CLINICAL DATA:  Left flank pain EXAM: CT ABDOMEN AND PELVIS WITHOUT CONTRAST TECHNIQUE: Multidetector CT imaging of the abdomen and pelvis was performed following the standard protocol without IV contrast. COMPARISON:  Flank pain FINDINGS: Lower chest: No acute abnormality. Hepatobiliary: No focal liver abnormality is seen. No gallstones, gallbladder wall thickening, or biliary dilatation. Pancreas: Unremarkable Spleen: Unremarkable Adrenals/Urinary Tract: Bilateral adrenal glands are unremarkable. Mild left hydroureteronephrosis with mild surrounding inflammatory change.  Stone measuring 6 mm is seen in the mid/distal left ureter on series 2, image 76. Mild bladder wall thickening, likely related to decompression. Stomach/Bowel: Stomach is within normal limits. Appendix appears normal. No evidence of bowel wall thickening, distention, or inflammatory changes. Vascular/Lymphatic: No significant vascular findings are present. No enlarged abdominal or pelvic lymph nodes. Reproductive: Prostate is unremarkable. Calcified plaques of the distal penis. Other: No abdominal wall hernia or abnormality. No abdominopelvic ascites. Musculoskeletal: No acute or significant osseous findings. IMPRESSION: Mild left hydroureteronephrosis with a 6 mm stone seen in the mid/distal  left ureter. Electronically Signed   By: Allegra Lai MD   On: 04/19/2021 09:04    EKG None  Radiology CT Renal Stone Study  Result Date: 04/19/2021 CLINICAL DATA:  Left flank pain EXAM: CT ABDOMEN AND PELVIS WITHOUT CONTRAST TECHNIQUE: Multidetector CT imaging of the abdomen and pelvis was performed following the standard protocol without IV contrast. COMPARISON:  Flank pain FINDINGS: Lower chest: No acute abnormality. Hepatobiliary: No focal liver abnormality is seen. No gallstones, gallbladder wall thickening, or biliary dilatation. Pancreas: Unremarkable Spleen: Unremarkable Adrenals/Urinary Tract: Bilateral adrenal glands are unremarkable. Mild left hydroureteronephrosis with mild surrounding inflammatory change. Stone measuring 6 mm is seen in the mid/distal left ureter on series 2, image 76. Mild bladder wall thickening, likely related to decompression. Stomach/Bowel: Stomach is within normal limits. Appendix appears normal. No evidence of bowel wall thickening, distention, or inflammatory changes. Vascular/Lymphatic: No significant vascular findings are present. No enlarged abdominal or pelvic lymph nodes. Reproductive: Prostate is unremarkable. Calcified plaques of the distal penis. Other: No abdominal wall  hernia or abnormality. No abdominopelvic ascites. Musculoskeletal: No acute or significant osseous findings. IMPRESSION: Mild left hydroureteronephrosis with a 6 mm stone seen in the mid/distal left ureter. Electronically Signed   By: Allegra Lai MD   On: 04/19/2021 09:04    Procedures Procedures   Medications Ordered in ED Medications  HYDROmorphone (DILAUDID) injection 0.5 mg (has no administration in time range)  ketorolac (TORADOL) 30 MG/ML injection 30 mg (has no administration in time range)    ED Course  I have reviewed the triage vital signs and the nursing notes.  Pertinent labs & imaging results that were available during my care of the patient were reviewed by me and considered in my medical decision making (see chart for details).    MDM Rules/Calculators/A&P                           Iv ns. Labs sent. Imaging ordered. Dilaudid iv, toradol iv.   Reviewed nursing notes and prior charts for additional history.   Labs reviewed/interpreted by me - +blood.   CT reviewed/interpreted by me - left ureteral stone, 6 mm.  Recheck patients pain improved/controlled. Discussed ct with patient.   Patient denies dysuria,  no fever/chills/sweats. Pain is resolved. Pt declines additional pain med in ED.   Pt currently appears stable for d/c.     Final Clinical Impression(s) / ED Diagnoses Final diagnoses:  None    Rx / DC Orders ED Discharge Orders     None        Cathren Laine, MD 04/19/21 [phone]

## 2021-04-19 NOTE — Discharge Instructions (Addendum)
It was our pleasure to provide your ER care today - we hope that you feel better.  Drink plenty of fluids/stay well hydrated. Strain urine. Your CT can shows a 6 mm kidney stone in the left ureter.   Take flomax as prescribed. Take motrin or aleve as need for pain. You may also take percocet as need for pain. No driving for the next 6 hours or when taking percocet. Also, do not take tylenol or acetaminophen containing medication when taking percocet. Take zofran as need for nausea.   Follow up with urologist in the next 3-4 days if symptoms fail to improve/resolve.  Return to ER if worse, new symptoms, fevers, severe/intractable pain, persistent vomiting, or other concern.

## 2021-06-15 DIAGNOSIS — K219 Gastro-esophageal reflux disease without esophagitis: Secondary | ICD-10-CM | POA: Diagnosis not present

## 2021-06-15 DIAGNOSIS — Z0001 Encounter for general adult medical examination with abnormal findings: Secondary | ICD-10-CM | POA: Diagnosis not present

## 2021-06-15 DIAGNOSIS — E119 Type 2 diabetes mellitus without complications: Secondary | ICD-10-CM | POA: Diagnosis not present

## 2021-06-15 DIAGNOSIS — E6609 Other obesity due to excess calories: Secondary | ICD-10-CM | POA: Diagnosis not present

## 2021-06-15 DIAGNOSIS — Z125 Encounter for screening for malignant neoplasm of prostate: Secondary | ICD-10-CM | POA: Diagnosis not present

## 2021-06-15 DIAGNOSIS — F320 Major depressive disorder, single episode, mild: Secondary | ICD-10-CM | POA: Diagnosis not present

## 2021-06-15 DIAGNOSIS — E782 Mixed hyperlipidemia: Secondary | ICD-10-CM | POA: Diagnosis not present

## 2021-06-15 DIAGNOSIS — M5136 Other intervertebral disc degeneration, lumbar region: Secondary | ICD-10-CM | POA: Diagnosis not present

## 2021-06-29 DIAGNOSIS — N200 Calculus of kidney: Secondary | ICD-10-CM | POA: Diagnosis not present

## 2021-06-29 DIAGNOSIS — Z6835 Body mass index (BMI) 35.0-35.9, adult: Secondary | ICD-10-CM | POA: Diagnosis not present

## 2021-06-29 DIAGNOSIS — R350 Frequency of micturition: Secondary | ICD-10-CM | POA: Diagnosis not present

## 2021-06-29 DIAGNOSIS — E6609 Other obesity due to excess calories: Secondary | ICD-10-CM | POA: Diagnosis not present

## 2022-01-21 DIAGNOSIS — R748 Abnormal levels of other serum enzymes: Secondary | ICD-10-CM | POA: Diagnosis not present

## 2022-02-01 DIAGNOSIS — E291 Testicular hypofunction: Secondary | ICD-10-CM | POA: Diagnosis not present

## 2022-02-01 DIAGNOSIS — R748 Abnormal levels of other serum enzymes: Secondary | ICD-10-CM | POA: Diagnosis not present

## 2022-02-01 DIAGNOSIS — E119 Type 2 diabetes mellitus without complications: Secondary | ICD-10-CM | POA: Diagnosis not present

## 2022-02-01 DIAGNOSIS — R5383 Other fatigue: Secondary | ICD-10-CM | POA: Diagnosis not present

## 2022-02-01 DIAGNOSIS — E782 Mixed hyperlipidemia: Secondary | ICD-10-CM | POA: Diagnosis not present

## 2022-02-01 DIAGNOSIS — F320 Major depressive disorder, single episode, mild: Secondary | ICD-10-CM | POA: Diagnosis not present

## 2022-02-01 DIAGNOSIS — K219 Gastro-esophageal reflux disease without esophagitis: Secondary | ICD-10-CM | POA: Diagnosis not present

## 2022-02-01 DIAGNOSIS — M5136 Other intervertebral disc degeneration, lumbar region: Secondary | ICD-10-CM | POA: Diagnosis not present

## 2022-02-01 DIAGNOSIS — Z6834 Body mass index (BMI) 34.0-34.9, adult: Secondary | ICD-10-CM | POA: Diagnosis not present

## 2022-02-01 DIAGNOSIS — M2550 Pain in unspecified joint: Secondary | ICD-10-CM | POA: Diagnosis not present

## 2022-02-14 DIAGNOSIS — E291 Testicular hypofunction: Secondary | ICD-10-CM | POA: Diagnosis not present

## 2022-03-06 DIAGNOSIS — E291 Testicular hypofunction: Secondary | ICD-10-CM | POA: Diagnosis not present

## 2022-03-22 DIAGNOSIS — E291 Testicular hypofunction: Secondary | ICD-10-CM | POA: Diagnosis not present

## 2022-04-22 DIAGNOSIS — M5451 Vertebrogenic low back pain: Secondary | ICD-10-CM | POA: Diagnosis not present

## 2022-08-23 DIAGNOSIS — K219 Gastro-esophageal reflux disease without esophagitis: Secondary | ICD-10-CM | POA: Diagnosis not present

## 2022-08-23 DIAGNOSIS — E782 Mixed hyperlipidemia: Secondary | ICD-10-CM | POA: Diagnosis not present

## 2022-08-23 DIAGNOSIS — M2550 Pain in unspecified joint: Secondary | ICD-10-CM | POA: Diagnosis not present

## 2022-08-23 DIAGNOSIS — E119 Type 2 diabetes mellitus without complications: Secondary | ICD-10-CM | POA: Diagnosis not present

## 2022-08-23 DIAGNOSIS — Z6835 Body mass index (BMI) 35.0-35.9, adult: Secondary | ICD-10-CM | POA: Diagnosis not present

## 2022-08-23 DIAGNOSIS — E6609 Other obesity due to excess calories: Secondary | ICD-10-CM | POA: Diagnosis not present

## 2022-08-23 DIAGNOSIS — R5383 Other fatigue: Secondary | ICD-10-CM | POA: Diagnosis not present

## 2022-08-23 DIAGNOSIS — R748 Abnormal levels of other serum enzymes: Secondary | ICD-10-CM | POA: Diagnosis not present

## 2022-08-23 DIAGNOSIS — E291 Testicular hypofunction: Secondary | ICD-10-CM | POA: Diagnosis not present

## 2022-08-23 DIAGNOSIS — F320 Major depressive disorder, single episode, mild: Secondary | ICD-10-CM | POA: Diagnosis not present

## 2022-08-23 DIAGNOSIS — Z0001 Encounter for general adult medical examination with abnormal findings: Secondary | ICD-10-CM | POA: Diagnosis not present

## 2022-08-23 DIAGNOSIS — Z1331 Encounter for screening for depression: Secondary | ICD-10-CM | POA: Diagnosis not present

## 2022-09-25 DIAGNOSIS — E291 Testicular hypofunction: Secondary | ICD-10-CM | POA: Diagnosis not present

## 2022-10-17 IMAGING — CT CT RENAL STONE PROTOCOL
2 of 4 series · 16 of 46 positions shown, 18 images · non-contrast
Comparison: Flank pain

CLINICAL DATA: Left flank pain

EXAM:
CT ABDOMEN AND PELVIS WITHOUT CONTRAST
TECHNIQUE: Multidetector CT imaging of the abdomen and pelvis was performed
following the standard protocol without IV contrast.

[Series 2: axial st · axial · 0.98mm/px · z∈[+712,+1202]mm · 13 of 113 slices shown, 15 images]
[im 8/113  soft-tissue]
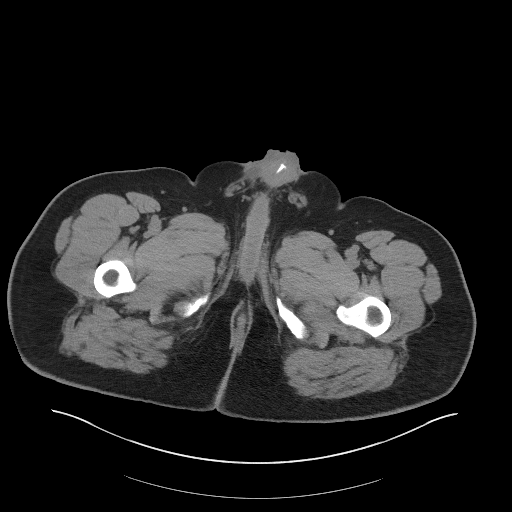
[im 8/113  bone]
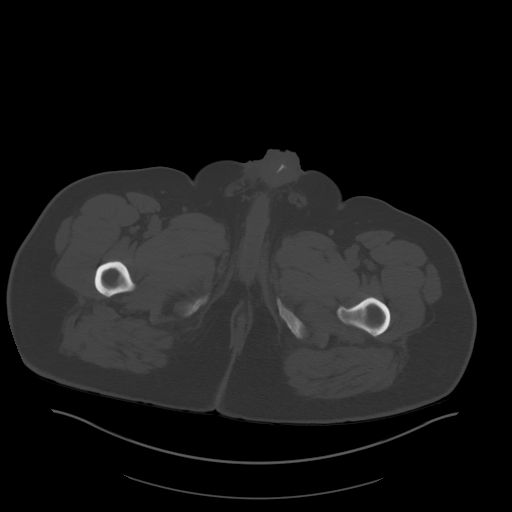
[im 15/113  soft-tissue]
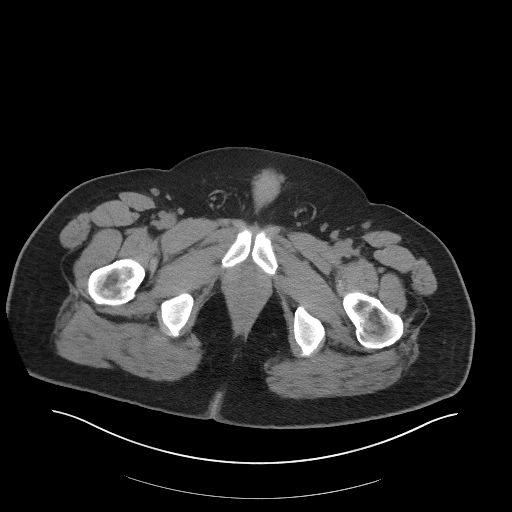
[im 22/113  soft-tissue]
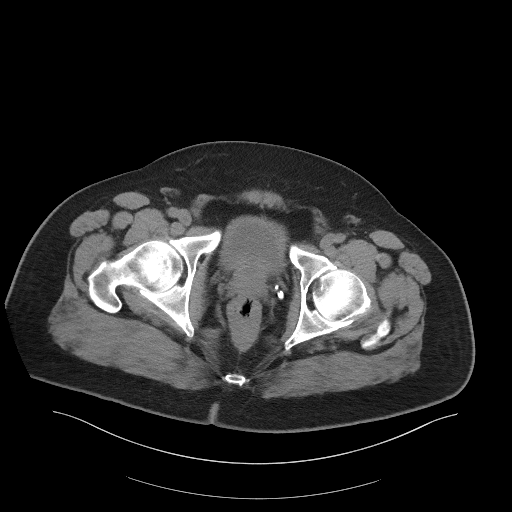
[im 36/113  soft-tissue]
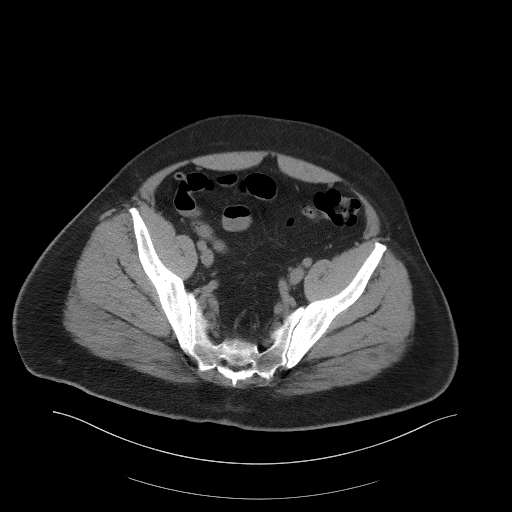
[im 43/113  soft-tissue]
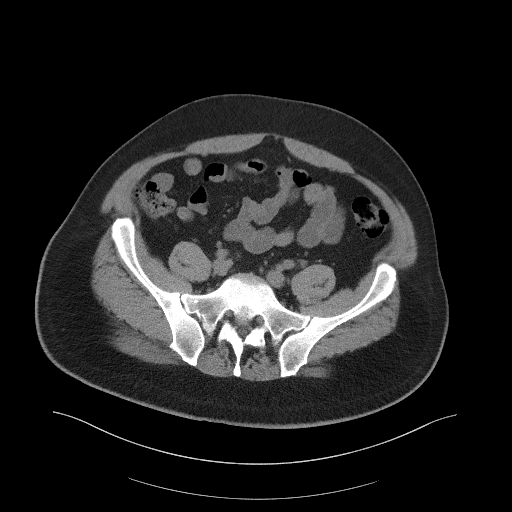
[im 50/113  soft-tissue]
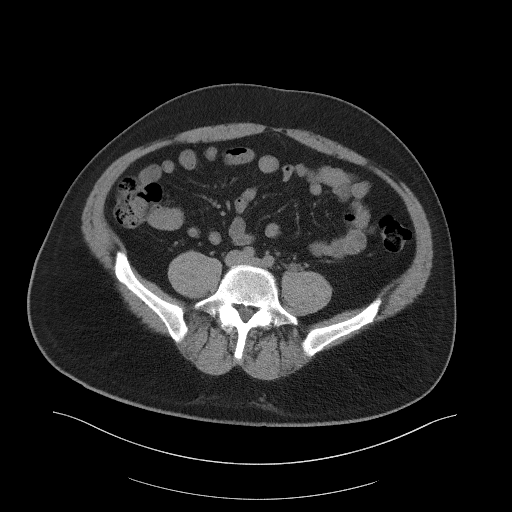
[im 57/113  soft-tissue]
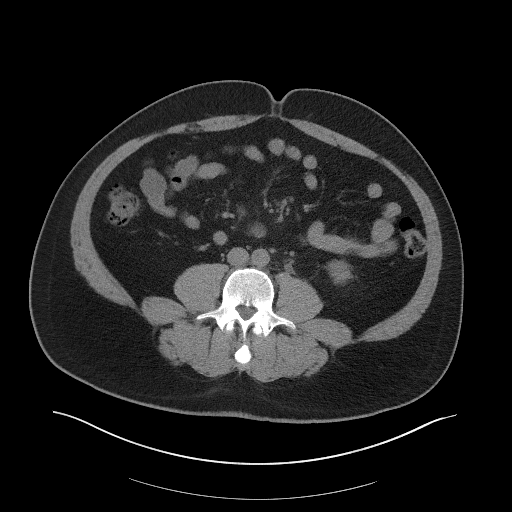
[im 64/113  soft-tissue]
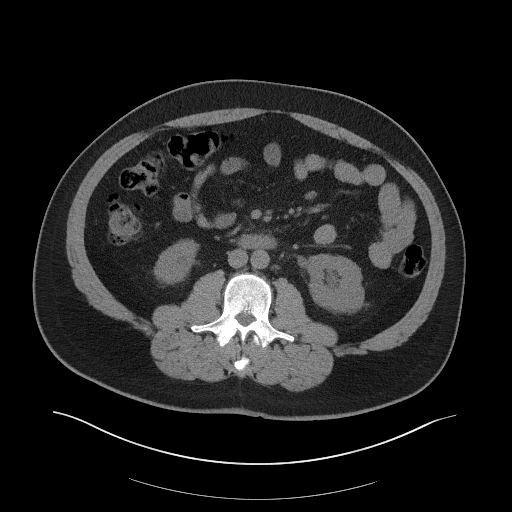
[im 71/113  soft-tissue]
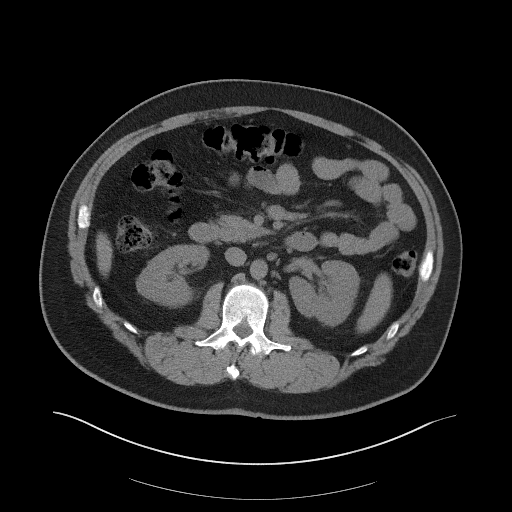
[im 71/113  bone]
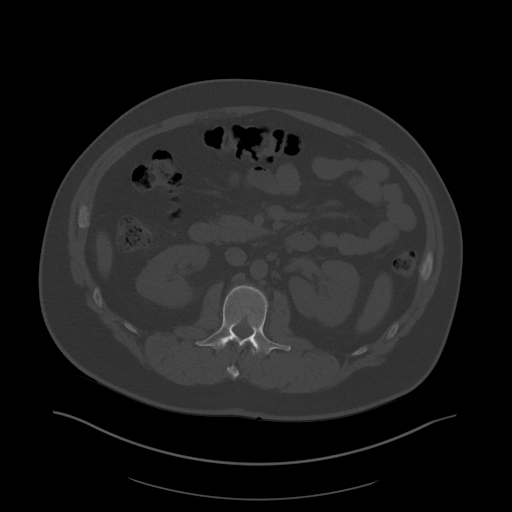
[im 78/113  soft-tissue]
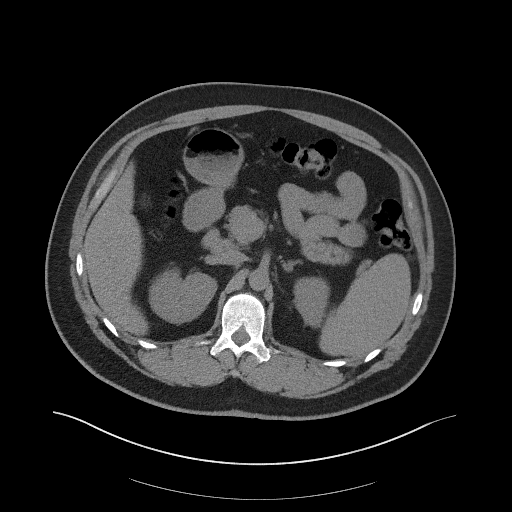
[im 92/113  soft-tissue]
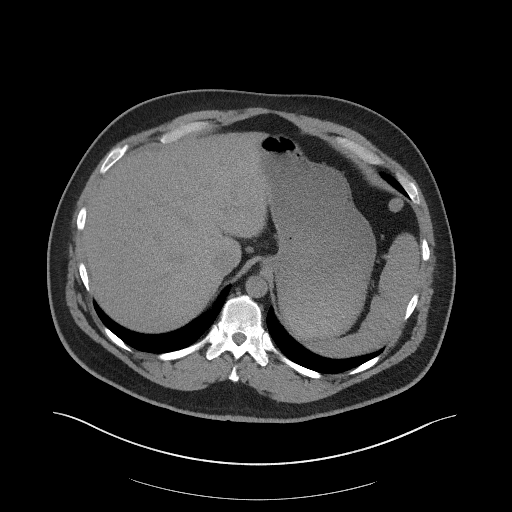
[im 99/113  soft-tissue]
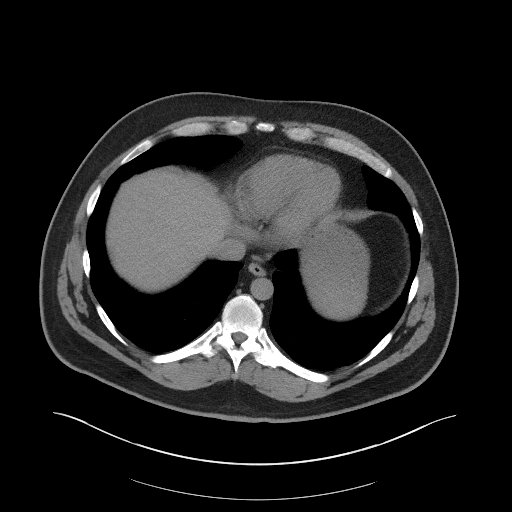
[im 106/113  soft-tissue]
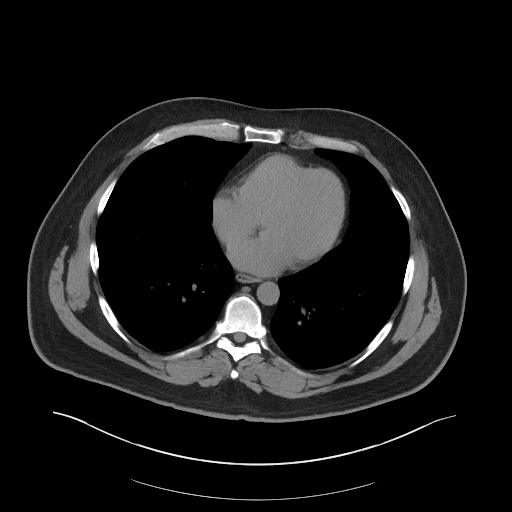

[Series 5: coronal st · coronal · 0.92mm/px · 3 of 119 slices shown]
[im 40/119  soft-tissue]
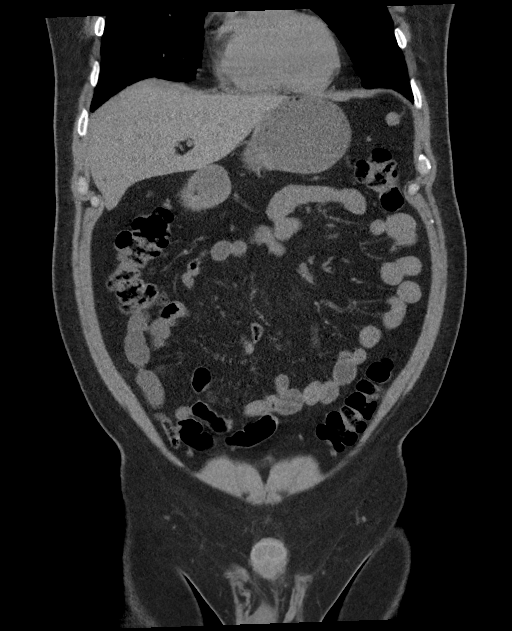
[im 53/119  soft-tissue]
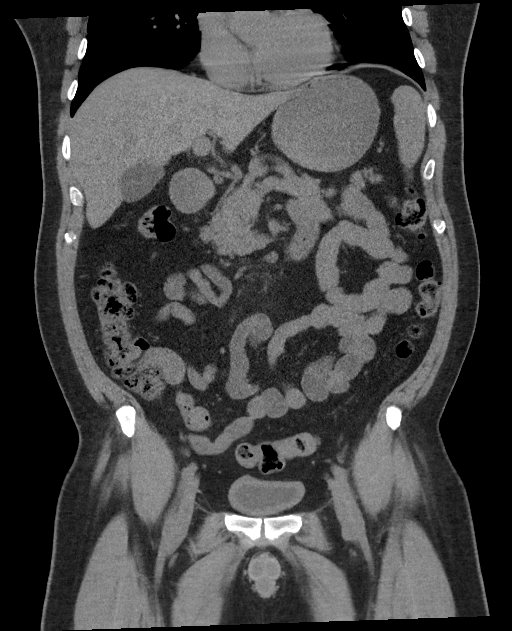
[im 66/119  soft-tissue]
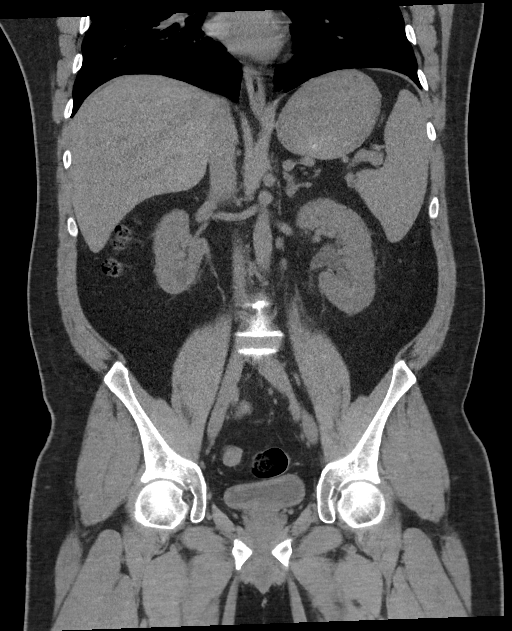

[16 of 46 positions shown; findings below may reference images not displayed]

FINDINGS: Lower chest: No acute abnormality.

Hepatobiliary: No focal liver abnormality is seen. No gallstones,
gallbladder wall thickening, or biliary dilatation.

Pancreas: Unremarkable

Spleen: Unremarkable

Adrenals/Urinary Tract: Bilateral adrenal glands are unremarkable.
Mild left hydroureteronephrosis with mild surrounding inflammatory
change. Stone measuring 6 mm is seen in the mid/distal left ureter
on series 2, image 76. Mild bladder wall thickening, likely related
to decompression.

Stomach/Bowel: Stomach is within normal limits. Appendix appears
normal. No evidence of bowel wall thickening, distention, or
inflammatory changes.

Vascular/Lymphatic: No significant vascular findings are present. No
enlarged abdominal or pelvic lymph nodes.

Reproductive: Prostate is unremarkable. Calcified plaques of the
distal penis.

Other: No abdominal wall hernia or abnormality. No abdominopelvic
ascites.

Musculoskeletal: No acute or significant osseous findings.
IMPRESSION: Mild left hydroureteronephrosis with a 6 mm stone seen in the
mid/distal left ureter.

## 2023-05-02 DIAGNOSIS — K219 Gastro-esophageal reflux disease without esophagitis: Secondary | ICD-10-CM | POA: Diagnosis not present

## 2023-05-02 DIAGNOSIS — E1159 Type 2 diabetes mellitus with other circulatory complications: Secondary | ICD-10-CM | POA: Diagnosis not present

## 2023-05-02 DIAGNOSIS — E782 Mixed hyperlipidemia: Secondary | ICD-10-CM | POA: Diagnosis not present

## 2023-05-02 DIAGNOSIS — E291 Testicular hypofunction: Secondary | ICD-10-CM | POA: Diagnosis not present

## 2023-05-02 DIAGNOSIS — M5136 Other intervertebral disc degeneration, lumbar region: Secondary | ICD-10-CM | POA: Diagnosis not present

## 2023-05-02 DIAGNOSIS — Z6833 Body mass index (BMI) 33.0-33.9, adult: Secondary | ICD-10-CM | POA: Diagnosis not present

## 2023-05-02 DIAGNOSIS — R748 Abnormal levels of other serum enzymes: Secondary | ICD-10-CM | POA: Diagnosis not present

## 2023-05-02 DIAGNOSIS — E6609 Other obesity due to excess calories: Secondary | ICD-10-CM | POA: Diagnosis not present

## 2023-05-02 DIAGNOSIS — F320 Major depressive disorder, single episode, mild: Secondary | ICD-10-CM | POA: Diagnosis not present

## 2024-01-30 DIAGNOSIS — R748 Abnormal levels of other serum enzymes: Secondary | ICD-10-CM | POA: Diagnosis not present

## 2024-01-30 DIAGNOSIS — E663 Overweight: Secondary | ICD-10-CM | POA: Diagnosis not present

## 2024-01-30 DIAGNOSIS — Z0001 Encounter for general adult medical examination with abnormal findings: Secondary | ICD-10-CM | POA: Diagnosis not present

## 2024-01-30 DIAGNOSIS — E291 Testicular hypofunction: Secondary | ICD-10-CM | POA: Diagnosis not present

## 2024-01-30 DIAGNOSIS — E1159 Type 2 diabetes mellitus with other circulatory complications: Secondary | ICD-10-CM | POA: Diagnosis not present

## 2024-01-30 DIAGNOSIS — E782 Mixed hyperlipidemia: Secondary | ICD-10-CM | POA: Diagnosis not present

## 2024-01-30 DIAGNOSIS — E7849 Other hyperlipidemia: Secondary | ICD-10-CM | POA: Diagnosis not present

## 2024-01-30 DIAGNOSIS — K219 Gastro-esophageal reflux disease without esophagitis: Secondary | ICD-10-CM | POA: Diagnosis not present

## 2024-01-30 DIAGNOSIS — Z1331 Encounter for screening for depression: Secondary | ICD-10-CM | POA: Diagnosis not present

## 2024-01-30 DIAGNOSIS — F320 Major depressive disorder, single episode, mild: Secondary | ICD-10-CM | POA: Diagnosis not present

## 2024-01-30 DIAGNOSIS — Z6829 Body mass index (BMI) 29.0-29.9, adult: Secondary | ICD-10-CM | POA: Diagnosis not present
